# Patient Record
Sex: Female | Born: 1950 | Race: White | Hispanic: No | Smoking: Current every day smoker
Health system: Southern US, Community
[De-identification: ages and names within clinical notes are randomized; demographics above are authoritative.]

## PROBLEM LIST (undated history)

## (undated) DIAGNOSIS — I10 Essential (primary) hypertension: Secondary | ICD-10-CM

---

## 2005-12-27 ENCOUNTER — Emergency Department (HOSPITAL_COMMUNITY): Admission: EM | Admit: 2005-12-27 | Discharge: 2005-12-27 | Payer: Self-pay | Admitting: Emergency Medicine

## 2006-04-25 ENCOUNTER — Emergency Department (HOSPITAL_COMMUNITY): Admission: EM | Admit: 2006-04-25 | Discharge: 2006-04-25 | Payer: Self-pay | Admitting: Emergency Medicine

## 2007-03-19 ENCOUNTER — Emergency Department (HOSPITAL_COMMUNITY): Admission: EM | Admit: 2007-03-19 | Discharge: 2007-03-19 | Payer: Self-pay | Admitting: Emergency Medicine

## 2008-10-22 ENCOUNTER — Emergency Department (HOSPITAL_COMMUNITY): Admission: EM | Admit: 2008-10-22 | Discharge: 2008-10-22 | Payer: Self-pay | Admitting: Emergency Medicine

## 2012-04-08 ENCOUNTER — Emergency Department (HOSPITAL_COMMUNITY)
Admission: EM | Admit: 2012-04-08 | Discharge: 2012-04-08 | Disposition: A | Discharge status: Home / Self Care | Payer: Managed Care, Other (non HMO) | Attending: Emergency Medicine | Admitting: Emergency Medicine

## 2012-04-08 ENCOUNTER — Emergency Department (HOSPITAL_COMMUNITY): Payer: Managed Care, Other (non HMO)

## 2012-04-08 ENCOUNTER — Encounter (HOSPITAL_COMMUNITY): Payer: Self-pay

## 2012-04-08 DIAGNOSIS — R059 Cough, unspecified: Secondary | ICD-10-CM | POA: Insufficient documentation

## 2012-04-08 DIAGNOSIS — R569 Unspecified convulsions: Secondary | ICD-10-CM | POA: Insufficient documentation

## 2012-04-08 DIAGNOSIS — R05 Cough: Secondary | ICD-10-CM | POA: Insufficient documentation

## 2012-04-08 DIAGNOSIS — R259 Unspecified abnormal involuntary movements: Secondary | ICD-10-CM | POA: Insufficient documentation

## 2012-04-08 DIAGNOSIS — I10 Essential (primary) hypertension: Secondary | ICD-10-CM | POA: Insufficient documentation

## 2012-04-08 DIAGNOSIS — Z79899 Other long term (current) drug therapy: Secondary | ICD-10-CM | POA: Insufficient documentation

## 2012-04-08 DIAGNOSIS — I2510 Atherosclerotic heart disease of native coronary artery without angina pectoris: Secondary | ICD-10-CM | POA: Insufficient documentation

## 2012-04-08 DIAGNOSIS — R51 Headache: Secondary | ICD-10-CM | POA: Insufficient documentation

## 2012-04-08 DIAGNOSIS — F17200 Nicotine dependence, unspecified, uncomplicated: Secondary | ICD-10-CM | POA: Insufficient documentation

## 2012-04-08 DIAGNOSIS — R000 Tachycardia, unspecified: Secondary | ICD-10-CM | POA: Insufficient documentation

## 2012-04-08 HISTORY — DX: Essential (primary) hypertension: I10

## 2012-04-08 LAB — RAPID URINE DRUG SCREEN, HOSP PERFORMED
Amphetamines: NOT DETECTED
Barbiturates: NOT DETECTED
Benzodiazepines: NOT DETECTED
Tetrahydrocannabinol: NOT DETECTED

## 2012-04-08 LAB — URINALYSIS, ROUTINE W REFLEX MICROSCOPIC
Bilirubin Urine: NEGATIVE
Ketones, ur: NEGATIVE mg/dL
Nitrite: NEGATIVE
Protein, ur: NEGATIVE mg/dL
Specific Gravity, Urine: 1.019 (ref 1.005–1.030)
Urobilinogen, UA: 0.2 mg/dL (ref 0.0–1.0)

## 2012-04-08 LAB — CBC: Hemoglobin: 14.5 g/dL (ref 12.0–15.0)

## 2012-04-08 LAB — DIFFERENTIAL
Lymphocytes Relative: 8 % — ABNORMAL LOW (ref 12–46)
Monocytes Absolute: 1.2 10*3/uL — ABNORMAL HIGH (ref 0.1–1.0)
Monocytes Relative: 9 % (ref 3–12)
Neutro Abs: 11.6 10*3/uL — ABNORMAL HIGH (ref 1.7–7.7)

## 2012-04-08 LAB — COMPREHENSIVE METABOLIC PANEL
AST: 45 U/L — ABNORMAL HIGH (ref 0–37)
Albumin: 3.6 g/dL (ref 3.5–5.2)
Calcium: 9.1 mg/dL (ref 8.4–10.5)
Chloride: 94 mEq/L — ABNORMAL LOW (ref 96–112)
Creatinine, Ser: 0.58 mg/dL (ref 0.50–1.10)
Total Bilirubin: 0.5 mg/dL (ref 0.3–1.2)

## 2012-04-08 LAB — URINE MICROSCOPIC-ADD ON

## 2012-04-08 LAB — GLUCOSE, CAPILLARY

## 2012-04-08 MED ORDER — SODIUM CHLORIDE 0.9 % IV BOLUS (SEPSIS)
500.0000 mL | Freq: Once | INTRAVENOUS | Status: AC
  Administered 2012-04-08: 500 mL via INTRAVENOUS

## 2012-04-08 NOTE — Discharge Instructions (Signed)
.Driving and Equipment Restrictions Some medical problems make it dangerous to drive, ride a bike, or use machines. Some of these problems are:  A hard blow to the head (concussion).   Passing out (fainting).   Twitching and shaking (seizures).   Low blood sugar.   Taking medicine to help you relax (sedatives).   Taking pain medicines.   Wearing an eye patch.   Wearing splints. This can make it hard to use parts of your body that you need to drive safely.  HOME CARE   Do not drive until your doctor says it is okay.   Do not use machines until your doctor says it is okay.  You may need a form signed by your doctor (medical release) before you can drive again. You may also need this form before you do other tasks where you need to be fully alert. MAKE SURE YOU:  Understand these instructions.   Will watch your condition.   Will get help right away if you are not doing well or get worse.  Document Released: 11/16/2004 Document Revised: 09/28/2011 Document Reviewed: 02/16/2010 [facility] Patient Information 2012 [place], [place].Epilepsy A seizure (convulsion) is a sudden change in brain function that causes a change in behavior, muscle activity, or ability to remain awake and alert. If a person has recurring seizures, this is called epilepsy. CAUSES  Epilepsy is a disorder with many possible causes. Anything that disturbs the normal pattern of brain cell activity can lead to seizures. Seizure can be caused from illness to brain damage to abnormal brain development. Epilepsy may develop because of:  An abnormality in brain wiring.   An imbalance of nerve signaling chemicals (neurotransmitters).   Some combination of these factors.  Scientists are learning an increasing amount about genetic causes of seizures. SYMPTOMS  The symptoms of a seizure can vary greatly from one person to another. These may include:  An aura, or warning that tells a person they are about to have a  seizure.   Abnormal sensations, such as abnormal smell or seeing flashing lights.   Sudden, general body stiffness.   Rhythmic jerking of the face, arm, or leg - on one or both sides.   Sudden change in consciousness.   The person may appear to be awake but not responding.   They may appear to be asleep but cannot be awakened.   Grimacing, chewing, lip smacking, or drooling.   Often there is a period of sleepiness after a seizure.  DIAGNOSIS  The description you give to your caregiver about what you experienced will help them understand your problems. Equally important is the description by any witnesses to your seizure. A physical exam, including a detailed neurological exam, is necessary. An EEG (electroencephalogram) is a painless test of your brain waves. In this test a diagram is created of your brain waves. These diagrams can be interpreted by a specialist. Pictures of your brain are usually taken with:  An MRI.   A CT scan.  Lab tests may be done to look for:  Signs of infection.   Abnormal blood chemistry.  PREVENTION  There is no way to prevent the development of epilepsy. If you have seizures that are typically triggered by an event (such as flashing lights), try to avoid the trigger. This can help you avoid a seizure.  PROGNOSIS  Most people with epilepsy lead outwardly normal lives. While epilepsy cannot currently be cured, for some people it does eventually go away. Most seizures do not  cause brain damage. It is not uncommon for people with epilepsy, especially children, to develop behavioral and emotional problems. These problems are sometimes the consequence of medicine for seizures or social stress. For some people with epilepsy, the risk of seizures restricts their independence and recreational activities. For example, some states refuse drivers licenses to people with epilepsy. Most women with epilepsy can become pregnant. They should discuss their epilepsy and the  medicine they are taking with their caregivers. Women with epilepsy have a 90 percent or better chance of having a normal, healthy baby. RISKS AND COMPLICATIONS  People with epilepsy are at increased risk of falls, accidents, and injuries. People with epilepsy are at special risk for two life-threatening conditions. These are status epilepticus and sudden unexplained death (extremely rare). Status epilepticus is a long lasting, continuous seizure that is a medical emergency. TREATMENT  Once epilepsy is diagnosed, it is important to begin treatment as soon as possible. For about 80 percent of those diagnosed with epilepsy, seizures can be controlled with modern medicines and surgical techniques. Some antiepileptic drugs can interfere with the effectiveness of oral contraceptives. In 1997, the FDA approved a pacemaker for the brain the (vagus nerve stimulator). This stimulator can be used for people with seizures that are not well-controlled by medicine. Studies have shown that in some cases, children may experience fewer seizures if they maintain a strict diet. The strict diet is called the ketogenic diet. This diet is rich in fats and low in carbohydrates. HOME CARE INSTRUCTIONS   Your caregiver will make recommendations about driving and safety in normal activities. Follow these carefully.   Take any medicine prescribed exactly as directed.   Do any blood tests requested to monitor the levels of your medicine.   The people you live and work with should know that you are prone to seizures. They should receive instructions on how to help you. In general, a witness to a seizure should:   Cushion your head and body.   Turn you on your side.   Avoid unnecessarily restraining you.   Not place anything inside your mouth.   Call for local emergency medical help if there is any question about what has occurred.   Keep a seizure diary. Record what you recall about any seizure, especially any possible  trigger.   If your caregiver has given you a follow-up appointment, it is very important to keep that appointment. Not keeping the appointment could result in permanent injury and disability. If there is any problem keeping the appointment, you must call back to this facility for assistance.  SEEK MEDICAL CARE IF:   You develop signs of infection or other illness. This might increase the risk of a seizure.   You seem to be having more frequent seizures.   Your seizure pattern is changing.  SEEK IMMEDIATE MEDICAL CARE IF:   A seizure does not stop after a few moments.   A seizure causes any difficulty in breathing.   A seizure results in a very severe headache.   A seizure leaves you with the inability to speak or use a part of your body.  MAKE SURE YOU:   Understand these instructions.   Will watch your condition.   Will get help right away if you are not doing well or get worse.  Document Released: 10/09/2005 Document Revised: 09/28/2011 Document Reviewed: 05/15/2008 [facility] Patient Information 2012 [place], [place].

## 2012-04-08 NOTE — ED Notes (Signed)
Pt return from ct scan 

## 2012-04-08 NOTE — ED Notes (Signed)
Patient transported to X-ray 

## 2012-04-08 NOTE — ED Provider Notes (Signed)
Medical screening examination/treatment/procedure(s) were performed by non-physician practitioner and as supervising physician I was immediately available for consultation/collaboration.    Date: 04/08/2012  Rate: 103  Rhythm: sinus tachycardia  QRS Axis: normal  Intervals: normal  ST/T Wave abnormalities: nonspecific ST changes  Conduction Disutrbances:none  Narrative Interpretation:   Old EKG Reviewed: unchanged   Flint Melter, MD 04/09/12 2013

## 2012-04-08 NOTE — ED Notes (Signed)
Discharged via teach back. Pt alert x3 respirations easy non labored.

## 2012-04-08 NOTE — ED Notes (Signed)
Per ems pt instructed brother to call 911 then went and laid down pt was found unresponsive in the bed with "jerking" noted to right arm. incontent of urine on arrival. ems states pt was restrained in transport for safety related to combativeness. Pt arrives alert to person and place and year unaware of month or day. Pt is unable to recall events. MAE randomly. Speech clear.

## 2012-04-08 NOTE — ED Provider Notes (Signed)
History     CSN: [id number]  Arrival date & time 04/08/12  1706   First MD Initiated Contact with Patient 04/08/12 1716      Chief Complaint  Patient presents with  . Seizures    (Consider location/radiation/quality/duration/timing/severity/associated sxs/prior treatment) HPI Comments: Patient is a 61 year old female current everyday smoker with a history of hyperlipidemia, hypertension, CAD with stent placement presents emergency department with new onset seizure.  Event occurred this afternoon around 430 p.m. and was witnessed by her significant other.  Patient began to have left shoulder shaking with her head down, eyes did not roll behind her head, loss of consciousness was present as well as urinary incontinence.  At length of episode is unknown but greater than 8 minutes as patient was still unresponsive when placed into EMS truck.  EMS states that they had to restrain patient because when she came to she became extremely combative.  Upon arrival to the emergency department patient was post ictal, alert and oriented to self and place, but unaware of time.  Patient states she currently has a headache located in the back of her head rated at a 4/10 in severity and that her thinking feels a little "foggy" consistent with post ictal state.  Patient's friends state they believe her to be at her baseline.  Patient denies any use of illicit drugs or alcohol today. No other complaints at this time.  The history is provided by the patient, a friend, medical records, a significant other and the EMS personnel.    Past Medical History  Diagnosis Date  . Hypertension     History reviewed. No pertinent past surgical history.  History reviewed. No pertinent family history.  History  Substance Use Topics  . Smoking status: Current Everyday Smoker -- 1.0 packs/day  . Smokeless tobacco: Not on file  . Alcohol Use: 3.6 oz/week    6 Cans of beer per week     6 pack daily    OB History    Grav Para Term Preterm Abortions TAB SAB Ect Mult Living                  Review of Systems  Constitutional: Negative for fever, chills and appetite change.  HENT: Negative for congestion.   Eyes: Negative for visual disturbance.  Respiratory: Negative for shortness of breath.   Cardiovascular: Negative for chest pain and leg swelling.  Gastrointestinal: Negative for abdominal pain.  Genitourinary: Negative for dysuria, urgency and frequency.  Neurological: Positive for tremors, seizures and headaches. Negative for dizziness, syncope, facial asymmetry, speech difficulty, weakness, light-headedness and numbness.  Psychiatric/Behavioral: Negative for confusion.  All other systems reviewed and are negative.    Allergies  Review of patient's allergies indicates no known allergies.  Home Medications   Current Outpatient Rx  Name Route Sig Dispense Refill  . ASPIRIN EC 81 MG PO TBEC Oral Take 81 mg by mouth daily.    Marland Kitchen LISINOPRIL-HYDROCHLOROTHIAZIDE 20-25 MG PO TABS Oral Take 1 tablet by mouth daily.    Marland Kitchen SIMVASTATIN 20 MG PO TABS Oral Take 20 mg by mouth every evening.      BP 192/85  Pulse 109  Temp 98.1 F (36.7 C) (Oral)  Resp 20  SpO2 98%  Physical Exam  Nursing note and vitals reviewed. Constitutional: She is oriented to person, place, and time. She appears well-developed and well-nourished. No distress.  HENT:  Head: Normocephalic and atraumatic.  Eyes: Conjunctivae and EOM are normal. Pupils are equal, round,  and reactive to light. No scleral icterus.  Neck: Normal range of motion and full passive range of motion without pain. Neck supple. No JVD present. Carotid bruit is not present. No rigidity. No Brudzinski's sign noted.  Cardiovascular: Regular rhythm, normal heart sounds and intact distal pulses.        tachycardic  Pulmonary/Chest: Effort normal and breath sounds normal. No respiratory distress. She has no wheezes. She has no rales.       LCAB, no respiratory  distress  Abdominal: Soft. Bowel sounds are normal. There is no tenderness.  Musculoskeletal: Normal range of motion.  Lymphadenopathy:    She has no cervical adenopathy.  Neurological: She is alert and oriented to person, place, and time. She has normal strength. No cranial nerve deficit or sensory deficit. She displays a negative Romberg sign. Coordination and gait normal. GCS eye subscore is 4. GCS verbal subscore is 5. GCS motor subscore is 6.       A&O x 2 (pt aware in summer, but month unknown).  Able to follow commands. PERRL, EOMs, nonystagmus. Shoulder shrug, facial muscles, tongue protrusion and swallow intact.  Motor strength 5/5 bilaterally including grip strength, triceps, hamstrings and ankle dorsiflexion.  Light touch intact in all 4 distal limbs.  Intact finger to nose, shin to heel and rapid alternating movements.  Skin: Skin is warm and dry. No rash noted. She is not diaphoretic.  Psychiatric: She has a normal mood and affect. Her behavior is normal.    ED Course  Procedures (including critical care time)  Labs Reviewed  GLUCOSE, CAPILLARY - Abnormal; Notable for the following:    Glucose-Capillary 181 (*)     All other components within normal limits  URINE RAPID DRUG SCREEN (HOSP PERFORMED)  URINALYSIS, ROUTINE W REFLEX MICROSCOPIC  URINE CULTURE  DRUG SCREEN PANEL (SERUM)  ETHANOL  CBC  COMPREHENSIVE METABOLIC PANEL  DIFFERENTIAL   Ct Head Wo Contrast  04/08/2012  *RADIOLOGY REPORT*  Clinical Data: Seizure  CT HEAD WITHOUT CONTRAST  Technique:  Contiguous axial images were obtained from the base of the skull through the vertex without contrast.  Comparison: None.  Findings: Prominence of the sulci, cisterns, and ventricles, in keeping with volume loss. There are mild subcortical and periventricular white matter hypodensities, a nonspecific finding most often seen with chronic microangiopathic changes.  There is no evidence for acute hemorrhage, overt hydrocephalus,  mass lesion, or abnormal extra-axial fluid collection.  No definite CT evidence for acute cortical based (large artery) infarction. Atherosclerotic vascular calcifications noted.  Mild right sphenoid chamber mucosal thickening versus small polyp. Otherwise, The visualized paranasal sinuses and mastoid air cells are predominately clear.  Nonspecific mild heterogeneity to the osseous marrow, can be seen in the setting of osteopenia.  IMPRESSION: Mild volume loss/white matter changes.  No definite acute intracranial abnormality.  Original Report Authenticated By: Waneta Martins, M.D.     No diagnosis found.    MDM  Hyperglycemia, seizure   Pt w intermediate UA, ? Not clean catch. Advised to follow up w pcp if becomes symptomatic. Patient with no evidence of focal neuro deficits on physical exam and is at mental baseline.  Labs and imaging have been reviewed.  Patient is advised to followup with neurologist in regards to today's event.  Spoke with patient and family in detail about driving restrictions until cleared by a neurologist.  Patient verbalizes understanding.  Answered all questions.  Patient is hemodynamically stable and in no acute distress prior to discharge.  Jaci Carrel, [place] 04/08/12 2117

## 2012-04-08 NOTE — ED Notes (Signed)
No seizure activity noted.

## 2012-04-09 LAB — URINE CULTURE: Culture  Setup Time: 201306180130

## 2012-04-11 LAB — DRUG SCREEN PANEL (SERUM)

## 2014-10-09 ENCOUNTER — Emergency Department (HOSPITAL_COMMUNITY)
Admission: EM | Admit: 2014-10-09 | Discharge: 2014-10-09 | Disposition: A | Discharge status: Home / Self Care | Payer: Medicare Other | Attending: Emergency Medicine | Admitting: Emergency Medicine

## 2014-10-09 ENCOUNTER — Encounter (HOSPITAL_COMMUNITY): Payer: Self-pay | Admitting: Emergency Medicine

## 2014-10-09 ENCOUNTER — Emergency Department (HOSPITAL_COMMUNITY): Payer: Medicare Other

## 2014-10-09 DIAGNOSIS — S42001A Fracture of unspecified part of right clavicle, initial encounter for closed fracture: Secondary | ICD-10-CM

## 2014-10-09 DIAGNOSIS — Y9389 Activity, other specified: Secondary | ICD-10-CM | POA: Insufficient documentation

## 2014-10-09 DIAGNOSIS — Y92009 Unspecified place in unspecified non-institutional (private) residence as the place of occurrence of the external cause: Secondary | ICD-10-CM | POA: Diagnosis not present

## 2014-10-09 DIAGNOSIS — I10 Essential (primary) hypertension: Secondary | ICD-10-CM | POA: Diagnosis not present

## 2014-10-09 DIAGNOSIS — Y998 Other external cause status: Secondary | ICD-10-CM | POA: Insufficient documentation

## 2014-10-09 DIAGNOSIS — Z79899 Other long term (current) drug therapy: Secondary | ICD-10-CM | POA: Insufficient documentation

## 2014-10-09 DIAGNOSIS — W07XXXA Fall from chair, initial encounter: Secondary | ICD-10-CM | POA: Insufficient documentation

## 2014-10-09 DIAGNOSIS — R55 Syncope and collapse: Secondary | ICD-10-CM | POA: Diagnosis present

## 2014-10-09 DIAGNOSIS — Z720 Tobacco use: Secondary | ICD-10-CM | POA: Diagnosis not present

## 2014-10-09 DIAGNOSIS — S42031A Displaced fracture of lateral end of right clavicle, initial encounter for closed fracture: Secondary | ICD-10-CM | POA: Insufficient documentation

## 2014-10-09 DIAGNOSIS — S0003XA Contusion of scalp, initial encounter: Secondary | ICD-10-CM | POA: Insufficient documentation

## 2014-10-09 DIAGNOSIS — Z7982 Long term (current) use of aspirin: Secondary | ICD-10-CM | POA: Insufficient documentation

## 2014-10-09 DIAGNOSIS — N390 Urinary tract infection, site not specified: Secondary | ICD-10-CM | POA: Insufficient documentation

## 2014-10-09 DIAGNOSIS — W01198A Fall on same level from slipping, tripping and stumbling with subsequent striking against other object, initial encounter: Secondary | ICD-10-CM | POA: Insufficient documentation

## 2014-10-09 DIAGNOSIS — R52 Pain, unspecified: Secondary | ICD-10-CM

## 2014-10-09 LAB — URINE MICROSCOPIC-ADD ON

## 2014-10-09 LAB — COMPREHENSIVE METABOLIC PANEL
ALBUMIN: 3.9 g/dL (ref 3.5–5.2)
ALK PHOS: 97 U/L (ref 39–117)
ALT: 24 U/L (ref 0–35)
ANION GAP: 16 — AB (ref 5–15)
AST: 26 U/L (ref 0–37)
BILIRUBIN TOTAL: 0.4 mg/dL (ref 0.3–1.2)
BUN: 19 mg/dL (ref 6–23)
CHLORIDE: 92 meq/L — AB (ref 96–112)
CO2: 24 meq/L (ref 19–32)
CREATININE: 0.83 mg/dL (ref 0.50–1.10)
Calcium: 8.8 mg/dL (ref 8.4–10.5)
GFR calc Af Amer: 84 mL/min — ABNORMAL LOW (ref >90)
GFR, EST NON AFRICAN AMERICAN: 72 mL/min — AB (ref >90)
Glucose, Bld: 104 mg/dL — ABNORMAL HIGH (ref 70–99)
POTASSIUM: 3.8 meq/L (ref 3.7–5.3)
Sodium: 132 mEq/L — ABNORMAL LOW (ref 137–147)
Total Protein: 8.2 g/dL (ref 6.0–8.3)

## 2014-10-09 LAB — CBC
HEMATOCRIT: 43.9 % (ref 36.0–46.0)
HEMOGLOBIN: 14.7 g/dL (ref 12.0–15.0)
MCH: 30.9 pg (ref 26.0–34.0)
MCHC: 33.5 g/dL (ref 30.0–36.0)
MCV: 92.4 fL (ref 78.0–100.0)
Platelets: 233 10*3/uL (ref 150–400)
RBC: 4.75 MIL/uL (ref 3.87–5.11)
RDW: 14.1 % (ref 11.5–15.5)
WBC: 11.8 10*3/uL — AB (ref 4.0–10.5)

## 2014-10-09 LAB — URINALYSIS, ROUTINE W REFLEX MICROSCOPIC
BILIRUBIN URINE: NEGATIVE
Glucose, UA: NEGATIVE mg/dL
HGB URINE DIPSTICK: NEGATIVE
KETONES UR: 15 mg/dL — AB
Nitrite: NEGATIVE
PH: 5 (ref 5.0–8.0)
Protein, ur: NEGATIVE mg/dL
SPECIFIC GRAVITY, URINE: 1.013 (ref 1.005–1.030)
UROBILINOGEN UA: 0.2 mg/dL (ref 0.0–1.0)

## 2014-10-09 LAB — TROPONIN I: Troponin I: <0.3 ng/mL (ref <0.30)

## 2014-10-09 MED ORDER — DEXTROSE 5 % IV SOLN
1.0000 g | Freq: Once | INTRAVENOUS | Status: AC
  Administered 2014-10-09: 1 g via INTRAVENOUS
  Filled 2014-10-09: qty 10

## 2014-10-09 MED ORDER — TRAMADOL HCL 50 MG PO TABS
50.0000 mg | ORAL_TABLET | Freq: Once | ORAL | Status: AC
Start: 2014-10-09 — End: 2014-10-09
  Administered 2014-10-09: 50 mg via ORAL
  Filled 2014-10-09: qty 1

## 2014-10-09 MED ORDER — SODIUM CHLORIDE 0.9 % IV BOLUS (SEPSIS)
500.0000 mL | Freq: Once | INTRAVENOUS | Status: AC
  Administered 2014-10-09: 500 mL via INTRAVENOUS

## 2014-10-09 MED ORDER — CEPHALEXIN 500 MG PO CAPS: 500.0000 mg | ORAL_CAPSULE | Freq: Four times a day (QID) | ORAL | Status: AC

## 2014-10-09 MED ORDER — HYDROCODONE-ACETAMINOPHEN 5-325 MG PO TABS: 1.0000 | ORAL_TABLET | Freq: Four times a day (QID) | ORAL | Status: AC | PRN

## 2014-10-09 NOTE — ED Notes (Signed)
[facility] states pt was playing cards at home was sitting at the table and had a syncopal esposide. Patient feel back hit her head and rt shoulder. Pt denies any pain in the back. Patient is not on any blood thinner.

## 2014-10-09 NOTE — Progress Notes (Signed)
Orthopedic Tech Progress Note Patient Details:  Patricia HeirDarlene Nesheiwat 12/23/1950 [id number]  Ortho Devices Type of Ortho Device: Shoulder immobilizer Ortho Device/Splint Location: RUE Ortho Device/Splint Interventions: Ordered, Application   Jennye MoccasinHughes, Lyrick Lagrand Craig 10/09/2014, 10:59 PM

## 2014-10-09 NOTE — ED Provider Notes (Signed)
CSN: [id number]     Arrival date & time 10/09/14  1819 History   First MD Initiated Contact with Patient 10/09/14 1821     Chief Complaint  Patient presents with  . Loss of Consciousness     (Consider location/radiation/quality/duration/timing/severity/associated sxs/prior Treatment) Patient is a 63 y.o. female presenting with syncope. The history is provided by the patient.  Loss of Consciousness Associated symptoms: no chest pain, no confusion, no fever, no headaches, no palpitations, no seizures, no shortness of breath, no vomiting and no weakness   pt with hx htn presents via ems after syncopal episode while playing cards. Had been playing cars for a very long while, had eaten or drank very little throughout course of day.  Pt states she began to feel lightheaded, faint, then warm, flushed as if would pass out.  She was sitting and table and was noted to fall back in chair.  Hit right shoulder and side of head against door frame, and then slid to floor.  V brief loc. No seizure activity noted. No incontinence. Pt quickly came to, was alert and oriented. Pt denies other prior syncopal episodes. Pt denies any associated palpitations or sense of rapid or irregular heartbeat. No current or recent cp or discomfort of any sort. No sob or unusual doe. No nv or diaphoresis. No recent blood loss, rectal bleeding, melena, or vaginal bleeding. No dysuria or gu c/o. No fever or chills. Denies abd pain or nv. No neck or back pain. No numbness/weakness. No headache. Right shoulder pain dull, moderate, non radiating. Worse w palp and movement.     Past Medical History  Diagnosis Date  . Hypertension    History reviewed. No pertinent past surgical history. No family history on file. History  Substance Use Topics  . Smoking status: Current Every Day Smoker -- 1.00 packs/day  . Smokeless tobacco: Not on file  . Alcohol Use: 3.6 oz/week    6 Cans of beer per week     Comment: 6 pack daily   OB  History    No data available     Review of Systems  Constitutional: Negative for fever and chills.  HENT: Negative for sore throat.   Eyes: Negative for pain and visual disturbance.  Respiratory: Negative for cough and shortness of breath.   Cardiovascular: Positive for syncope. Negative for chest pain, palpitations and leg swelling.  Gastrointestinal: Negative for vomiting, abdominal pain, diarrhea and blood in stool.  Genitourinary: Negative for dysuria and flank pain.  Musculoskeletal: Negative for back pain and neck pain.  Skin: Negative for rash and wound.  Neurological: Negative for seizures, weakness, numbness and headaches.  Hematological: Does not bruise/bleed easily.  Psychiatric/Behavioral: Negative for confusion.      Allergies  Review of patient's allergies indicates no known allergies.  Home Medications   Prior to Admission medications   Medication Sig Start Date End Date Taking? Authorizing Provider  aspirin EC 81 MG tablet Take 81 mg by mouth daily.    Historical Provider, MD  lisinopril-hydrochlorothiazide (PRINZIDE,ZESTORETIC) 20-25 MG per tablet Take 1 tablet by mouth daily.    Historical Provider, MD  simvastatin (ZOCOR) 20 MG tablet Take 20 mg by mouth every evening.    Historical Provider, MD   BP 145/60 mmHg  Pulse 71  Temp(Src) 98.8 F (37.1 C) (Oral)  Resp 26  Ht 5\' 4"  (1.626 m)  Wt 140 lb (63.504 kg)  BMI 24.02 kg/m2  SpO2 94% Physical Exam  Constitutional: She is oriented  to person, place, and time. She appears well-developed and well-nourished. No distress.  HENT:  Minimal contusion right scalp.   Eyes: Conjunctivae are normal. Pupils are equal, round, and reactive to light. No scleral icterus.  Neck: Normal range of motion. Neck supple. No tracheal deviation present.  No bruit  Cardiovascular: Normal rate, regular rhythm, normal heart sounds and intact distal pulses.  Exam reveals no gallop and no friction rub.   No murmur  heard. Pulmonary/Chest: Effort normal and breath sounds normal. No respiratory distress. She exhibits no tenderness.  Abdominal: Soft. Normal appearance and bowel sounds are normal. She exhibits no distension and no mass. There is no tenderness. There is no rebound and no guarding.  No puls mass  Genitourinary:  No cva tenderness  Musculoskeletal: Normal range of motion. She exhibits no edema or tenderness.  Tenderness right shoulder. Good rom bil ext, no other pain or focal bony tenderness. CTLS spine, non tender, aligned, no step off.   Neurological: She is alert and oriented to person, place, and time. No cranial nerve deficit.  Motor intact bil. stre  5/5, sens intact.   Skin: Skin is warm and dry. No rash noted.  Psychiatric: She has a normal mood and affect.  Nursing note and vitals reviewed.   ED Course  Procedures (including critical care time) Labs Review  Results for orders placed or performed during the hospital encounter of 10/09/14  CBC  Result Value Ref Range   WBC 11.8 (H) 4.0 - 10.5 K/uL   RBC 4.75 3.87 - 5.11 MIL/uL   Hemoglobin 14.7 12.0 - 15.0 g/dL   HCT 47.843.9 29.536.0 - 62.146.0 %   MCV 92.4 78.0 - 100.0 fL   MCH 30.9 26.0 - 34.0 pg   MCHC 33.5 30.0 - 36.0 g/dL   RDW 30.814.1 65.711.5 - 84.615.5 %   Platelets 233 150 - 400 K/uL  Comprehensive metabolic panel  Result Value Ref Range   Sodium 132 (L) 137 - 147 mEq/L   Potassium 3.8 3.7 - 5.3 mEq/L   Chloride 92 (L) 96 - 112 mEq/L   CO2 24 19 - 32 mEq/L   Glucose, Bld 104 (H) 70 - 99 mg/dL   BUN 19 6 - 23 mg/dL   Creatinine, Ser 9.620.83 0.50 - 1.10 mg/dL   Calcium 8.8 8.4 - 95.210.5 mg/dL   Total Protein 8.2 6.0 - 8.3 g/dL   Albumin 3.9 3.5 - 5.2 g/dL   AST 26 0 - 37 U/L   ALT 24 0 - 35 U/L   Alkaline Phosphatase 97 39 - 117 U/L   Total Bilirubin 0.4 0.3 - 1.2 mg/dL   GFR calc non Af Amer 72 (L) >90 mL/min   GFR calc Af Amer 84 (L) >90 mL/min   Anion gap 16 (H) 5 - 15  Troponin I  Result Value Ref Range   Troponin I <0.30  <0.30 ng/mL  Urinalysis, Routine w reflex microscopic  Result Value Ref Range   Color, Urine YELLOW YELLOW   APPearance CLOUDY (A) CLEAR   Specific Gravity, Urine 1.013 1.005 - 1.030   pH 5.0 5.0 - 8.0   Glucose, UA NEGATIVE NEGATIVE mg/dL   Hgb urine dipstick NEGATIVE NEGATIVE   Bilirubin Urine NEGATIVE NEGATIVE   Ketones, ur 15 (A) NEGATIVE mg/dL   Protein, ur NEGATIVE NEGATIVE mg/dL   Urobilinogen, UA 0.2 0.0 - 1.0 mg/dL   Nitrite NEGATIVE NEGATIVE   Leukocytes, UA SMALL (A) NEGATIVE  Urine microscopic-add on  Result Value  Ref Range   Squamous Epithelial / LPF FEW (A) RARE   WBC, UA 11-20 <3 WBC/hpf   RBC / HPF 0-2 <3 RBC/hpf   Bacteria, UA FEW (A) RARE   Casts HYALINE CASTS (A) NEGATIVE   Dg Shoulder Right  10/09/2014   CLINICAL DATA:  Larey SeatFell on right shoulder. Pain is anterior superior right shoulder  EXAM: RIGHT SHOULDER - 2+ VIEW  COMPARISON:  None.  FINDINGS: There is a fracture of the distal aspect of the right clavicle with comminution. The acromioclavicular joint is separated.  IMPRESSION: Fracture the distal clavicle at the acromioclavicular joint.   Electronically Signed   By: Genevive BiStewart  Edmunds M.D.   On: 10/09/2014 20:29   Ct Head Wo Contrast  10/09/2014   CLINICAL DATA:  Syncopal episode today, hitting head.  EXAM: CT HEAD WITHOUT CONTRAST  TECHNIQUE: Contiguous axial images were obtained from the base of the skull through the vertex without contrast.  COMPARISON:  04/08/2012.  FINDINGS: No evidence for acute infarction, hemorrhage, mass lesion, hydrocephalus, or extra-axial fluid. Mild cerebral and cerebellar atrophy. Slight hypoattenuation of white matter, likely chronic microvascular ischemic change. Calvarium intact. No sinus or mastoid disease. Vascular calcification. Similar appearance to priors.  IMPRESSION: No acute intracranial abnormality.   Electronically Signed   By: Davonna BellingJohn  Curnes M.D.   On: 10/09/2014 20:10       EKG Interpretation   Date/Time:  Friday  October 09 2014 18:28:52 EST Ventricular Rate:  74 PR Interval:  166 QRS Duration: 90 QT Interval:  435 QTC Calculation: 483 R Axis:   82 Text Interpretation:  Sinus rhythm Nonspecific T wave abnormality  Confirmed by Denton LankSTEINL  MD, Caryn BeeKEVIN ([id number]) on 10/09/2014 6:42:27 PM      MDM   Iv ns. Monitor. Labs.  Ecg.  Iv ns bolus. Po fluids.  Reviewed nursing notes and prior charts for additional history.   Recheck pain improved/controlled w meds.  Discussed xray w pt.  Shoulder immobilizer, ice.   Recheck spine nt.   cts neg acute.  Pt eating and drinking,  No nv.  nsr on monitor. No dysrhythmia.   Pt feels breathing at baseline. Afeb. No current or recent cp or discomfort of any sort.    Discussed disposition, offered admit/obs vs d/c. Pt indicates she wants to go home, feels well apart from right shoulder pain which is better w meds.  Possible uti on labs, u cx sent, will rx pending cx. Rocephin iv. rx keflex for home  Discussed close pcp and ortho follow up.  Discussed return precautions.      Suzi RootsKevin E Iceis Knab, MD 10/09/14 2252

## 2014-10-09 NOTE — ED Notes (Signed)
Family is requesting pt get a CT of her head. MD made aware.

## 2014-10-09 NOTE — Discharge Instructions (Signed)
It was our pleasure to provide your ER care today - we hope that you feel better.  Wear shoulder immobilizer for comfort.  Ice/coldpacks to sore area.   Take motrin or aleve as need for pain. You may also take hydrocodone as need for pain. No driving for the next 6 hours or when taking hydrocodone. Also, do not take tylenol or acetaminophen containing medication when taking hydrocodone.  The lab tests show a possible urine infection - drink plenty of fluids, take antibiotic (keflex) as prescribed. A urine culture was sent the results of which will be back in 2 days time - have your doctor follow up on that result then.  Follow up with primary care doctor Monday for recheck.  Also follow up with orthopedist in the coming week - call office Monday to arrange appointment.  Return to ER right away if worse, new symptoms, weak/faint, chest pain, trouble breathing, fevers, persistent vomiting, other concern.    Syncope Syncope is a medical term for fainting or passing out. This means you lose consciousness and drop to the ground. People are generally unconscious for less than 5 minutes. You may have some muscle twitches for up to 15 seconds before waking up and returning to normal. Syncope occurs more often in older adults, but it can happen to anyone. While most causes of syncope are not dangerous, syncope can be a sign of a serious medical problem. It is important to seek medical care.  CAUSES  Syncope is caused by a sudden drop in blood flow to the brain. The specific cause is often not determined. Factors that can bring on syncope include:  Taking medicines that lower blood pressure.  Sudden changes in posture, such as standing up quickly.  Taking more medicine than prescribed.  Standing in one place for too long.  Seizure disorders.  Dehydration and excessive exposure to heat.  Low blood sugar (hypoglycemia).  Straining to have a bowel movement.  Heart disease, irregular  heartbeat, or other circulatory problems.  Fear, emotional distress, seeing blood, or severe pain. SYMPTOMS  Right before fainting, you may:  Feel dizzy or light-headed.  Feel nauseous.  See all white or all black in your field of vision.  Have cold, clammy skin. DIAGNOSIS  Your health care provider will ask about your symptoms, perform a physical exam, and perform an electrocardiogram (ECG) to record the electrical activity of your heart. Your health care provider may also perform other heart or blood tests to determine the cause of your syncope which may include:  Transthoracic echocardiogram (TTE). During echocardiography, sound waves are used to evaluate how blood flows through your heart.  Transesophageal echocardiogram (TEE).  Cardiac monitoring. This allows your health care provider to monitor your heart rate and rhythm in real time.  Holter monitor. This is a portable device that records your heartbeat and can help diagnose heart arrhythmias. It allows your health care provider to track your heart activity for several days, if needed.  Stress tests by exercise or by giving medicine that makes the heart beat faster. TREATMENT  In most cases, no treatment is needed. Depending on the cause of your syncope, your health care provider may recommend changing or stopping some of your medicines. HOME CARE INSTRUCTIONS  Have someone stay with you until you feel stable.  Do not drive, use machinery, or play sports until your health care provider says it is okay.  Keep all follow-up appointments as directed by your health care provider.  Lie down  right away if you start feeling like you might faint. Breathe deeply and steadily. Wait until all the symptoms have passed.  Drink enough fluids to keep your urine clear or pale yellow.  If you are taking blood pressure or heart medicine, get up slowly and take several minutes to sit and then stand. This can reduce dizziness. SEEK  IMMEDIATE MEDICAL CARE IF:   You have a severe headache.  You have unusual pain in the chest, abdomen, or back.  You are bleeding from your mouth or rectum, or you have black or tarry stool.  You have an irregular or very fast heartbeat.  You have pain with breathing.  You have repeated fainting or seizure-like jerking during an episode.  You faint when sitting or lying down.  You have confusion.  You have trouble walking.  You have severe weakness.  You have vision problems. If you fainted, call your local emergency services (911 in [place] Do not drive yourself to the hospital.  MAKE SURE YOU:  Understand these instructions.  Will watch your condition.  Will get help right away if you are not doing well or get worse. Document Released: 10/09/2005 Document Revised: 10/14/2013 Document Reviewed: 12/08/2011 [facility] Patient Information 2015 [place], [place]. This information is not intended to replace advice given to you by your health care provider. Make sure you discuss any questions you have with your health care provider.    Clavicle Fracture The clavicle, also called the collarbone, is the long bone that connects your shoulder to your rib cage. You can feel your collarbone at the top of your shoulders and rib cage. A clavicle fracture is a broken clavicle. It is a common injury that can happen at any age.  CAUSES Common causes of a clavicle fracture include:  A direct blow to your shoulder.  A car accident.  A fall, especially if you try to break your fall with an outstretched arm. RISK FACTORS You may be at increased risk if:  You are younger than 25 years or older than 75 years. Most clavicle fractures happen to people who are younger than 25 years.  You are a female.  You play contact sports. SIGNS AND SYMPTOMS A fractured clavicle is painful. It also makes it hard to move your arm. Other signs and symptoms may include:  A shoulder that drops downward and  forward.  Pain when trying to lift your shoulder.  Bruising, swelling, and tenderness over your clavicle.  A grinding noise when you try to move your shoulder.  A bump over your clavicle. DIAGNOSIS Your health care provider can usually diagnose a clavicle fracture by asking about your injury and examining your shoulder and clavicle. He or she may take an X-ray to determine the position of your clavicle. TREATMENT Treatment depends on the position of your clavicle after the fracture:  If the broken ends of the bone are not out of place, your health care provider may put your arm in a sling or wrap a support bandage around your chest (figure-of-eight wrap).  If the broken ends of the bone are out of place, you may need surgery. Surgery may involve placing screws, pins, or plates to keep your clavicle stable while it heals. Healing may take about 3 months. When your health care provider thinks your fracture has healed enough, you may have to do physical therapy to regain normal movement and build up your arm strength. HOME CARE INSTRUCTIONS   Apply ice to the injured area:  Put ice in a plastic bag.  Place a towel between your skin and the bag.  Leave the ice on for 20 minutes, 2-3 times a day.  If you have a wrap or splint:  Wear it all the time, and remove it only to take a bath or shower.  When you bathe or shower, keep your shoulder in the same position as when the sling or wrap is on.  Do not lift your arm.  If you have a figure-of-eight wrap:  Another person must tighten it every day.  It should be tight enough to hold your shoulders back.  Allow enough room to place your index finger between your body and the strap.  Loosen the wrap immediately if you feel numbness or tingling in your hands.  Only take medicines as directed by your health care provider.  Avoid activities that make the injury or pain worse for 4-6 weeks after surgery.  Keep all follow-up  appointments. SEEK MEDICAL CARE IF:  Your medicine is not helping to relieve pain and swelling. SEEK IMMEDIATE MEDICAL CARE IF:  Your arm is numb, cold, or pale, even when the splint is loose. MAKE SURE YOU:   Understand these instructions.  Will watch your condition.  Will get help right away if you are not doing well or get worse. Document Released: 07/19/2005 Document Revised: 10/14/2013 Document Reviewed: 09/01/2013 [facility] Patient Information 2015 [place]. This information is not intended to replace advice given to you by your health care provider. Make sure you discuss any questions you have with your health care provider.   Cryotherapy Cryotherapy means treatment with cold. Ice or gel packs can be used to reduce both pain and swelling. Ice is the most helpful within the first 24 to 48 hours after an injury or flare-up from overusing a muscle or joint. Sprains, strains, spasms, burning pain, shooting pain, and aches can all be eased with ice. Ice can also be used when recovering from surgery. Ice is effective, has very few side effects, and is safe for most people to use. PRECAUTIONS  Ice is not a safe treatment option for people with:  Raynaud phenomenon. This is a condition affecting small blood vessels in the extremities. Exposure to cold may cause your problems to return.  Cold hypersensitivity. There are many forms of cold hypersensitivity, including:  Cold urticaria. Red, itchy hives appear on the skin when the tissues begin to warm after being iced.  Cold erythema. This is a red, itchy rash caused by exposure to cold.  Cold hemoglobinuria. Red blood cells break down when the tissues begin to warm after being iced. The hemoglobin that carry oxygen are passed into the urine because they cannot combine with blood proteins fast enough.  Numbness or altered sensitivity in the area being iced. If you have any of the following conditions, do not use ice until you have  discussed cryotherapy with your caregiver:  Heart conditions, such as arrhythmia, angina, or chronic heart disease.  High blood pressure.  Healing wounds or open skin in the area being iced.  Current infections.  Rheumatoid arthritis.  Poor circulation.  Diabetes. Ice slows the blood flow in the region it is applied. This is beneficial when trying to stop inflamed tissues from spreading irritating chemicals to surrounding tissues. However, if you expose your skin to cold temperatures for too long or without the proper protection, you can damage your skin or nerves. Watch for signs of skin damage due to cold. HOME  CARE INSTRUCTIONS Follow these tips to use ice and cold packs safely.  Place a dry or damp towel between the ice and skin. A damp towel will cool the skin more quickly, so you may need to shorten the time that the ice is used.  For a more rapid response, add gentle compression to the ice.  Ice for no more than 10 to 20 minutes at a time. The bonier the area you are icing, the less time it will take to get the benefits of ice.  Check your skin after 5 minutes to make sure there are no signs of a poor response to cold or skin damage.  Rest 20 minutes or more between uses.  Once your skin is numb, you can end your treatment. You can test numbness by very lightly touching your skin. The touch should be so light that you do not see the skin dimple from the pressure of your fingertip. When using ice, most people will feel these normal sensations in this order: cold, burning, aching, and numbness.  Do not use ice on someone who cannot communicate their responses to pain, such as small children or people with dementia. HOW TO MAKE AN ICE PACK Ice packs are the most common way to use ice therapy. Other methods include ice massage, ice baths, and cryosprays. Muscle creams that cause a cold, tingly feeling do not offer the same benefits that ice offers and should not be used as a  substitute unless recommended by your caregiver. To make an ice pack, do one of the following:  Place crushed ice or a bag of frozen vegetables in a sealable plastic bag. Squeeze out the excess air. Place this bag inside another plastic bag. Slide the bag into a pillowcase or place a damp towel between your skin and the bag.  Mix 3 parts water with 1 part rubbing alcohol. Freeze the mixture in a sealable plastic bag. When you remove the mixture from the freezer, it will be slushy. Squeeze out the excess air. Place this bag inside another plastic bag. Slide the bag into a pillowcase or place a damp towel between your skin and the bag. SEEK MEDICAL CARE IF:  You develop white spots on your skin. This may give the skin a blotchy (mottled) appearance.  Your skin turns blue or pale.  Your skin becomes waxy or hard.  Your swelling gets worse. MAKE SURE YOU:   Understand these instructions.  Will watch your condition.  Will get help right away if you are not doing well or get worse. Document Released: 06/05/2011 Document Revised: 02/23/2014 Document Reviewed: 06/05/2011 [facility] Patient Information 2015 [place], [place]. This information is not intended to replace advice given to you by your health care provider. Make sure you discuss any questions you have with your health care provider.    Urinary Tract Infection Urinary tract infections (UTIs) can develop anywhere along your urinary tract. Your urinary tract is your body's drainage system for removing wastes and extra water. Your urinary tract includes two kidneys, two ureters, a bladder, and a urethra. Your kidneys are a pair of bean-shaped organs. Each kidney is about the size of your fist. They are located below your ribs, one on each side of your spine. CAUSES Infections are caused by microbes, which are microscopic organisms, including fungi, viruses, and bacteria. These organisms are so small that they can only be seen through a  microscope. Bacteria are the microbes that most commonly cause UTIs. SYMPTOMS  Symptoms  of UTIs may vary by age and gender of the patient and by the location of the infection. Symptoms in young women typically include a frequent and intense urge to urinate and a painful, burning feeling in the bladder or urethra during urination. Older women and men are more likely to be tired, shaky, and weak and have muscle aches and abdominal pain. A fever may mean the infection is in your kidneys. Other symptoms of a kidney infection include pain in your back or sides below the ribs, nausea, and vomiting. DIAGNOSIS To diagnose a UTI, your caregiver will ask you about your symptoms. Your caregiver also will ask to provide a urine sample. The urine sample will be tested for bacteria and white blood cells. White blood cells are made by your body to help fight infection. TREATMENT  Typically, UTIs can be treated with medication. Because most UTIs are caused by a bacterial infection, they usually can be treated with the use of antibiotics. The choice of antibiotic and length of treatment depend on your symptoms and the type of bacteria causing your infection. HOME CARE INSTRUCTIONS  If you were prescribed antibiotics, take them exactly as your caregiver instructs you. Finish the medication even if you feel better after you have only taken some of the medication.  Drink enough water and fluids to keep your urine clear or pale yellow.  Avoid caffeine, tea, and carbonated beverages. They tend to irritate your bladder.  Empty your bladder often. Avoid holding urine for long periods of time.  Empty your bladder before and after sexual intercourse.  After a bowel movement, women should cleanse from front to back. Use each tissue only once. SEEK MEDICAL CARE IF:   You have back pain.  You develop a fever.  Your symptoms do not begin to resolve within 3 days. SEEK IMMEDIATE MEDICAL CARE IF:   You have severe back  pain or lower abdominal pain.  You develop chills.  You have nausea or vomiting.  You have continued burning or discomfort with urination. MAKE SURE YOU:   Understand these instructions.  Will watch your condition.  Will get help right away if you are not doing well or get worse. Document Released: 07/19/2005 Document Revised: 04/09/2012 Document Reviewed: 11/17/2011 [facility] Patient Information 2015 [place], [place]. This information is not intended to replace advice given to you by your health care provider. Make sure you discuss any questions you have with your health care provider.

## 2014-10-11 LAB — URINE CULTURE

## 2015-04-21 IMAGING — CT CT HEAD W/O CM
1 series · 16 of 30 positions shown, 20 images · non-contrast
Comparison: 04/08/2012.

CLINICAL DATA: Syncopal episode today, hitting head.

EXAM:
CT HEAD WITHOUT CONTRAST
TECHNIQUE: Contiguous axial images were obtained from the base of the skull
through the vertex without contrast.

[Series 2: head 5.0 h30s · axial · 0.43mm/px · z∈[-171,-26]mm · 16 of 33 slices shown, 20 images]
[im 2/33  brain]
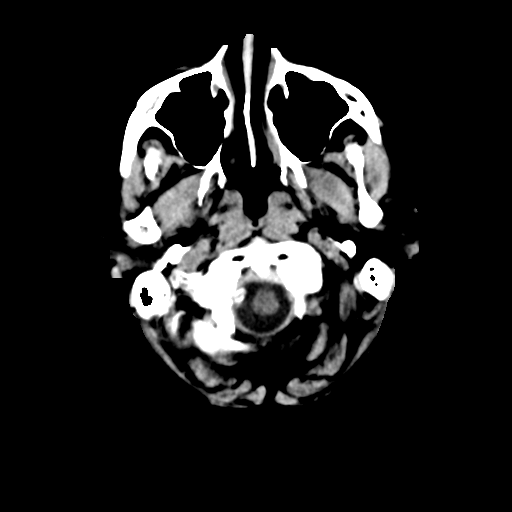
[im 2/33  bone]
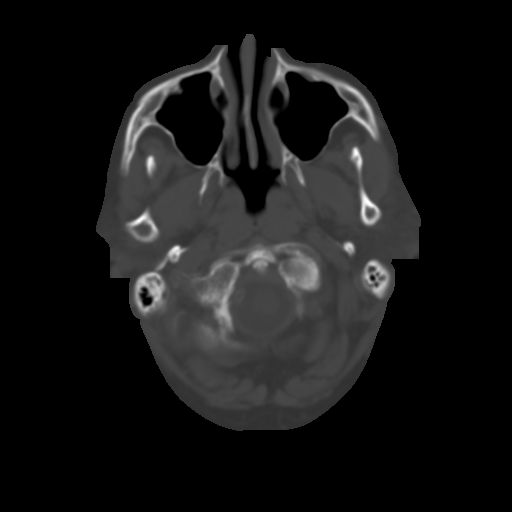
[im 4/33  brain]
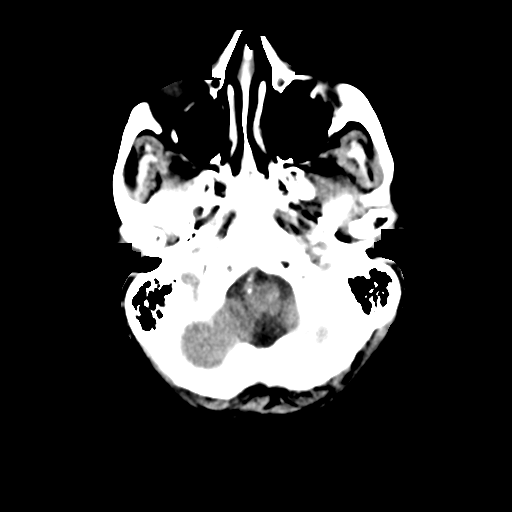
[im 6/33  brain]
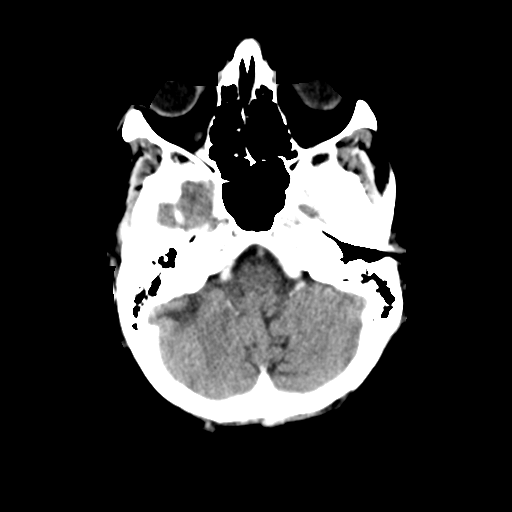
[im 8/33  brain]
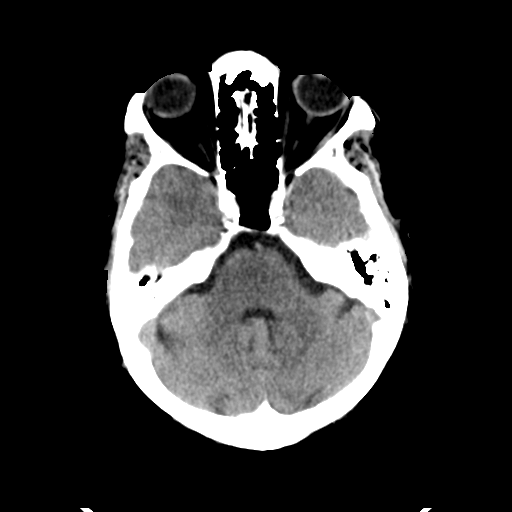
[im 9/33  brain]
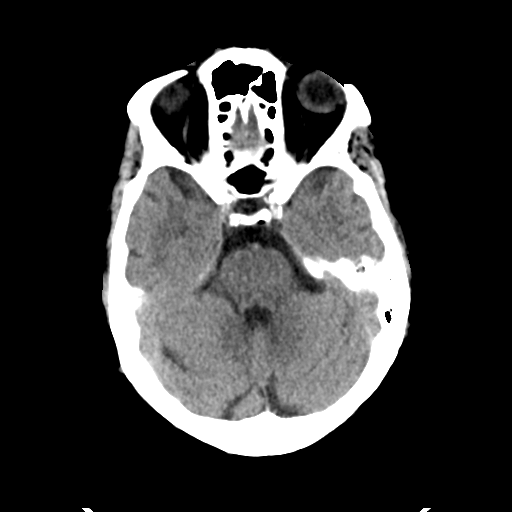
[im 9/33  bone]
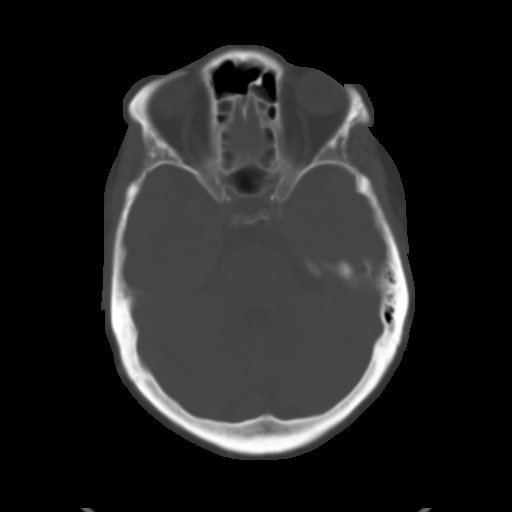
[im 12/33  brain]
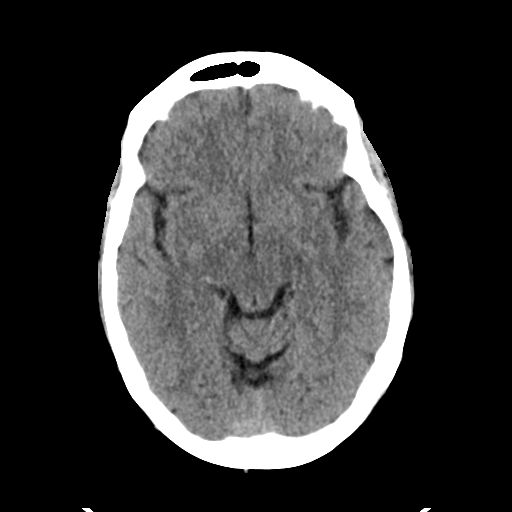
[im 14/33  brain]
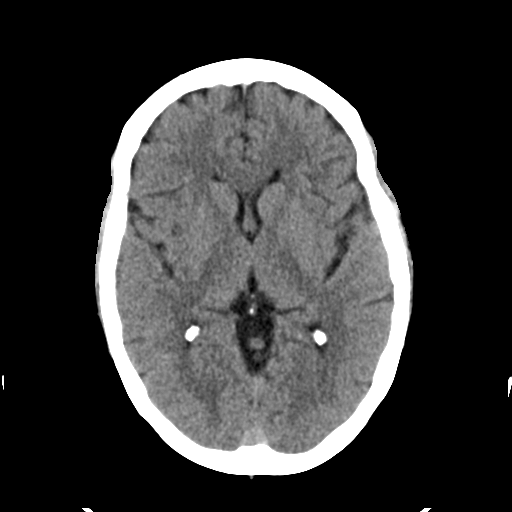
[im 16/33  brain]
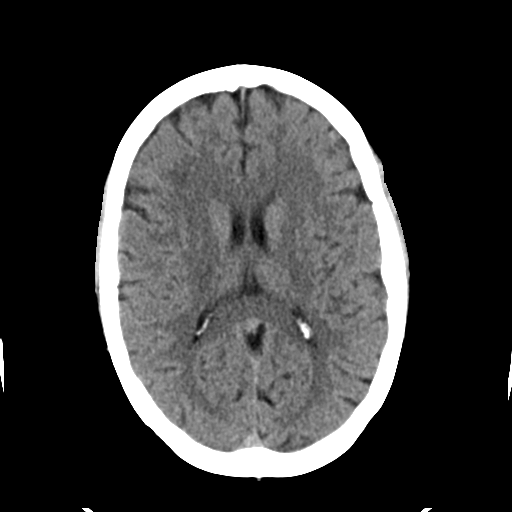
[im 17/33  brain]
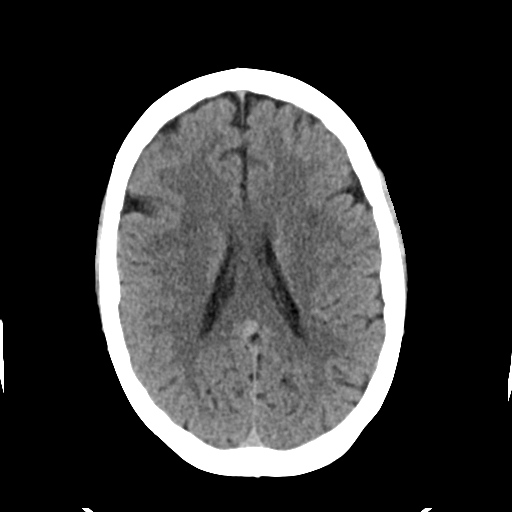
[im 17/33  bone]
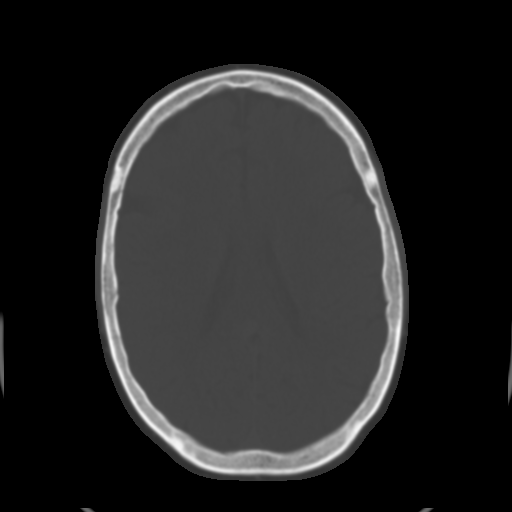
[im 19/33  brain]
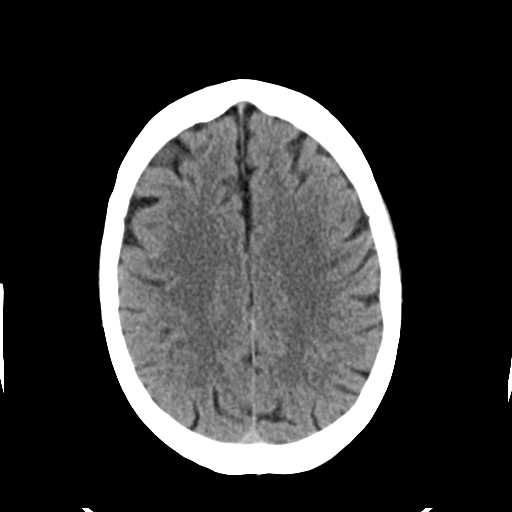
[im 21/33  brain]
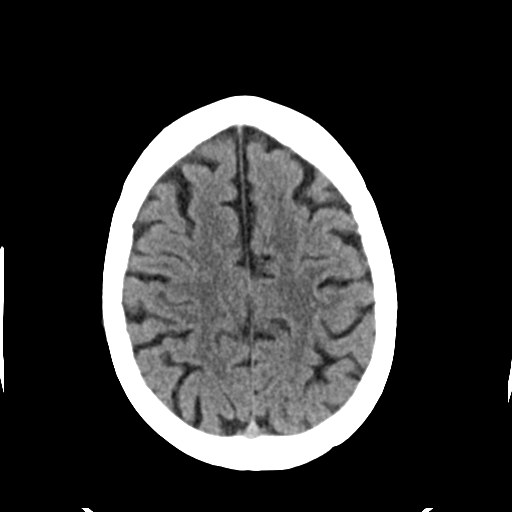
[im 24/33  brain]
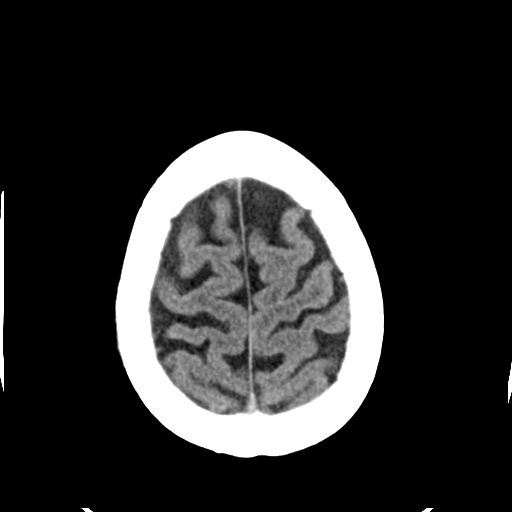
[im 25/33  brain]
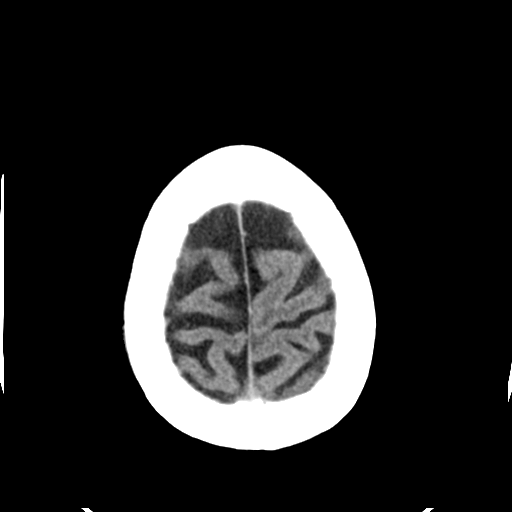
[im 25/33  bone]
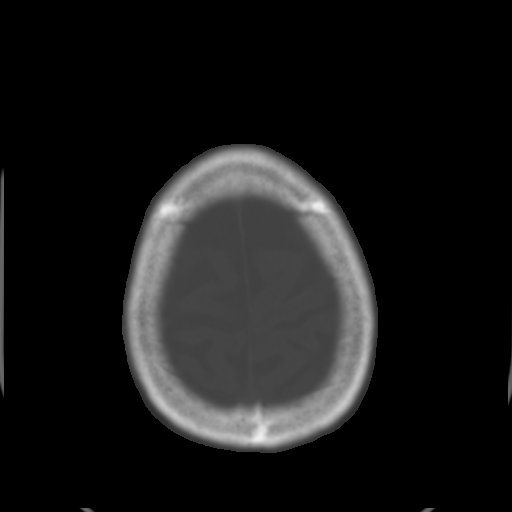
[im 27/33  brain]
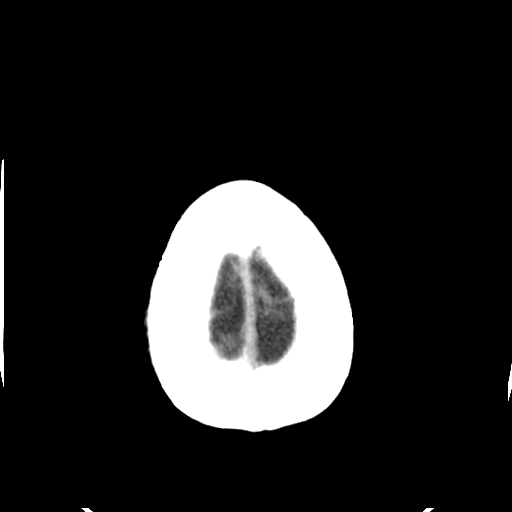
[im 29/33  brain]
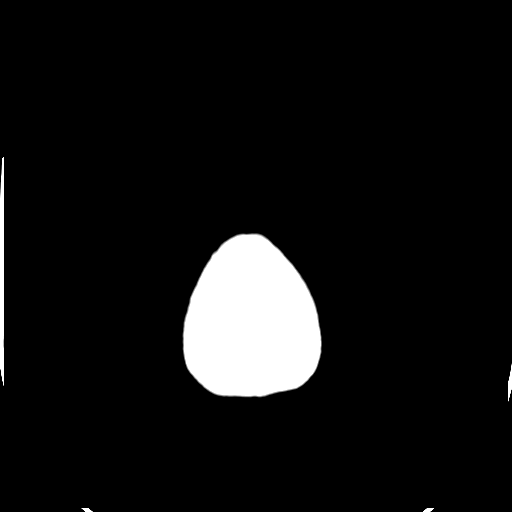
[im 31/33  brain]
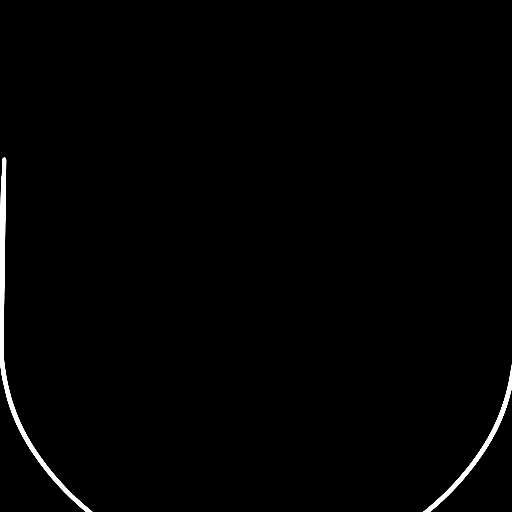

[16 of 30 positions shown; findings below may reference images not displayed]

FINDINGS: No evidence for acute infarction, hemorrhage, mass lesion,
hydrocephalus, or extra-axial fluid. Mild cerebral and cerebellar
atrophy. Slight hypoattenuation of white matter, likely chronic
microvascular ischemic change. Calvarium intact. No sinus or mastoid
disease. Vascular calcification. Similar appearance to priors.
IMPRESSION: No acute intracranial abnormality.

## 2015-04-21 IMAGING — CR DG SHOULDER 2+V*R*
2 series · 2 of 2 positions shown · non-contrast
Comparison: None.

CLINICAL DATA: Fell on right shoulder. Pain is anterior superior
right shoulder

EXAM:
RIGHT SHOULDER - 2+ VIEW

[shoulder grashey]
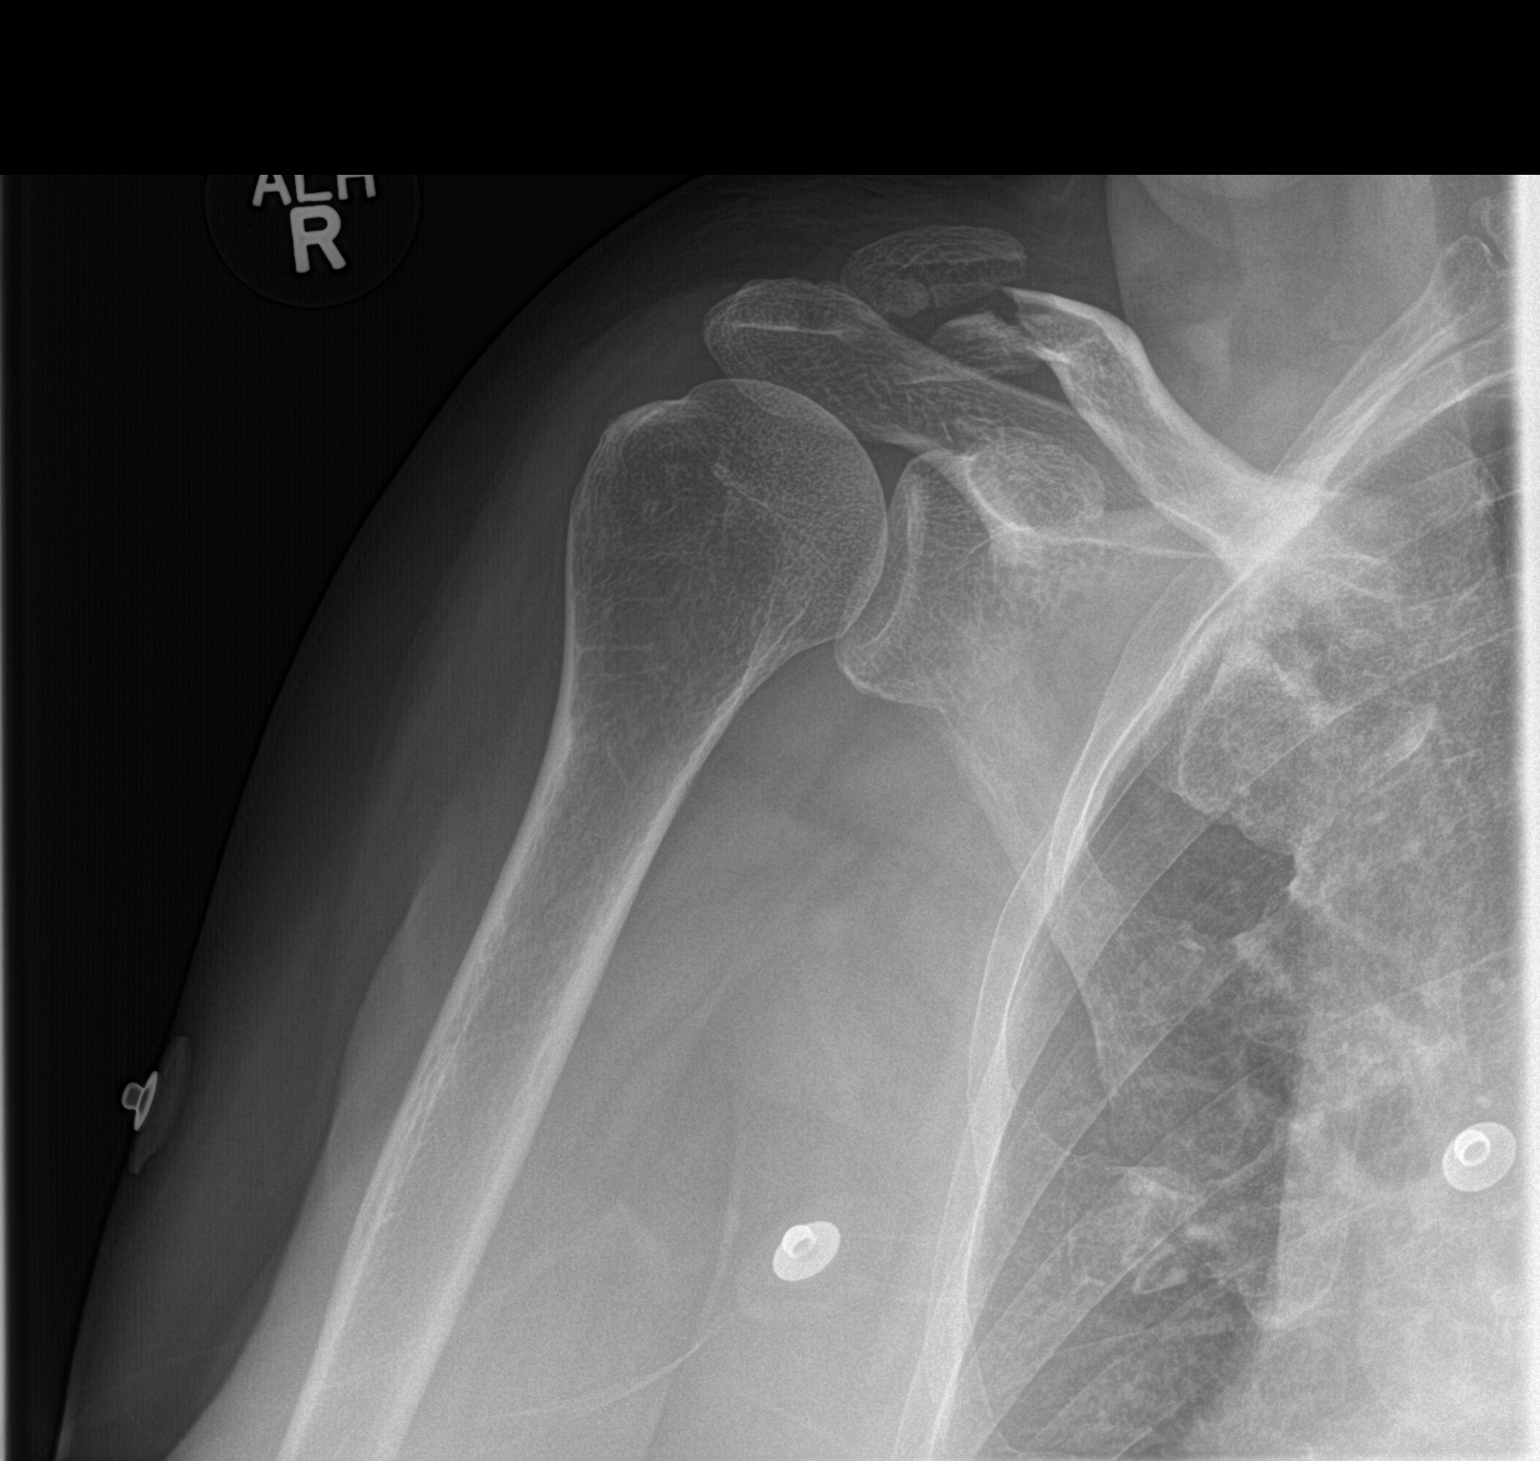

[shoulder y view]
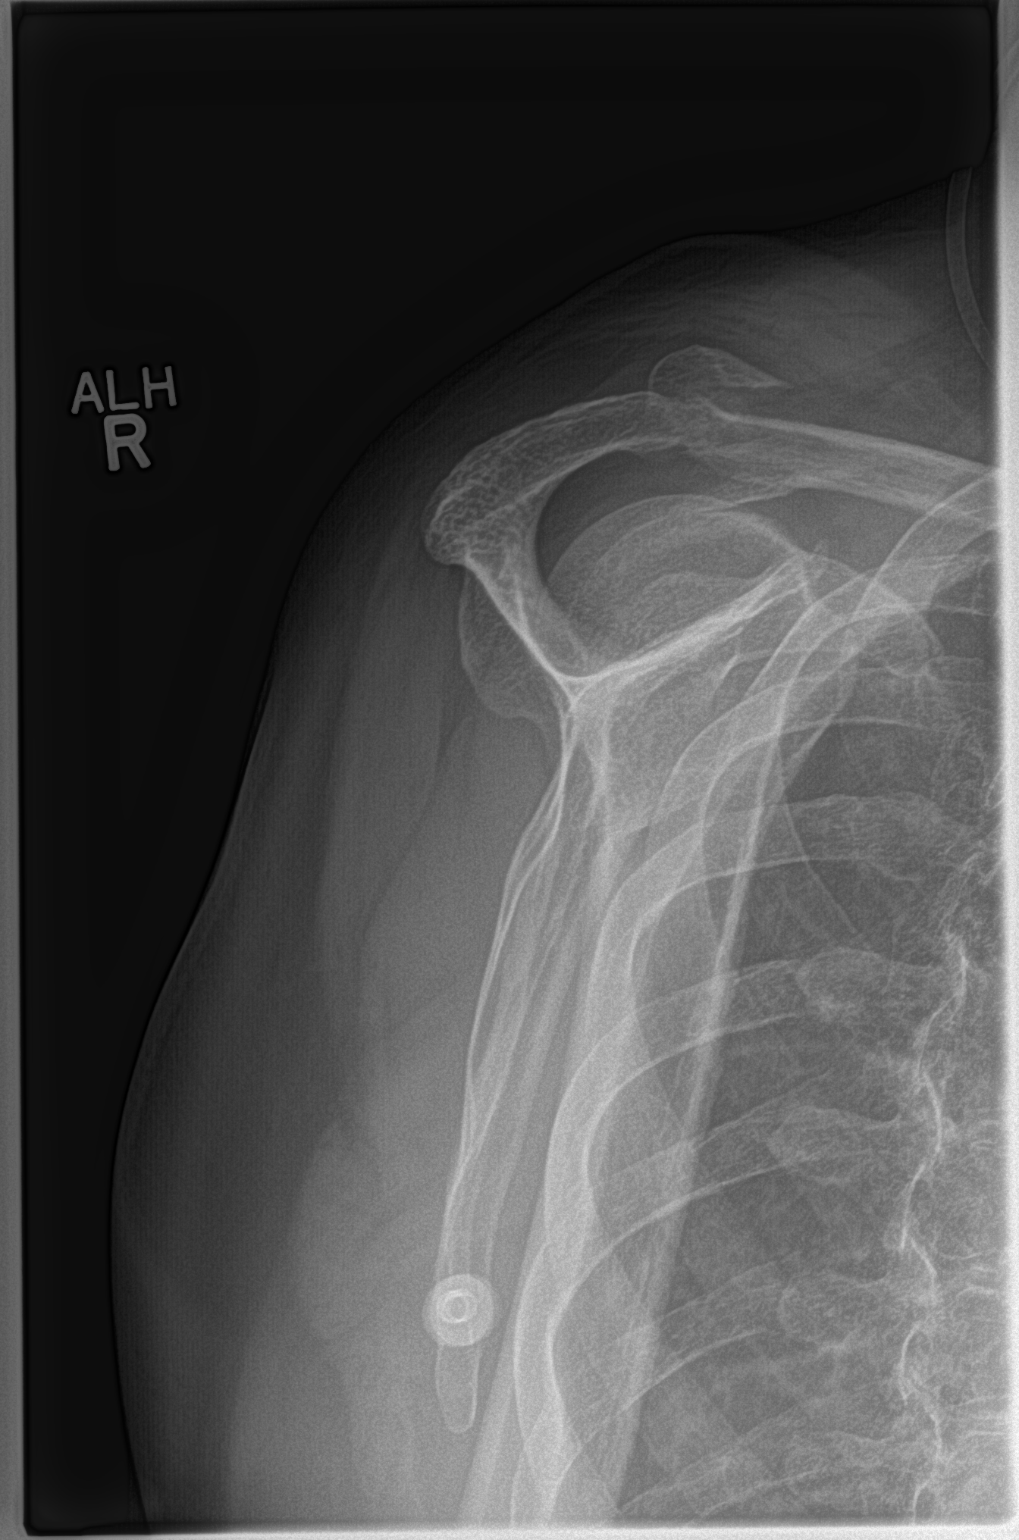

[2 of 2 positions shown; findings below may reference images not displayed]

FINDINGS: There is a fracture of the distal aspect of the right clavicle with
comminution. The acromioclavicular joint is separated.
IMPRESSION: Fracture the distal clavicle at the acromioclavicular joint.
